# Patient Record
Sex: Male | Born: 1945 | Race: White | Hispanic: No | Marital: Married
Health system: Southern US, Community
[De-identification: ages and names within clinical notes are randomized; demographics above are authoritative.]

## PROBLEM LIST (undated history)

## (undated) DIAGNOSIS — E7800 Pure hypercholesterolemia, unspecified: Secondary | ICD-10-CM

## (undated) DIAGNOSIS — I10 Essential (primary) hypertension: Secondary | ICD-10-CM

## (undated) DIAGNOSIS — I2510 Atherosclerotic heart disease of native coronary artery without angina pectoris: Secondary | ICD-10-CM

## (undated) DIAGNOSIS — K4090 Unilateral inguinal hernia, without obstruction or gangrene, not specified as recurrent: Secondary | ICD-10-CM

## (undated) HISTORY — DX: Atherosclerotic heart disease of native coronary artery without angina pectoris: I25.10

## (undated) HISTORY — DX: Essential (primary) hypertension: I10

## (undated) HISTORY — DX: Unilateral inguinal hernia, without obstruction or gangrene, not specified as recurrent: K40.90

## (undated) HISTORY — DX: Pure hypercholesterolemia, unspecified: E78.00

---

## 2000-06-02 ENCOUNTER — Encounter: Admission: RE | Admit: 2000-06-02 | Discharge: 2000-06-02 | Payer: Self-pay | Admitting: Occupational Medicine

## 2000-06-02 ENCOUNTER — Encounter: Payer: Self-pay | Admitting: Occupational Medicine

## 2000-11-11 ENCOUNTER — Encounter: Payer: Self-pay | Admitting: Occupational Medicine

## 2000-11-11 ENCOUNTER — Encounter: Admission: RE | Admit: 2000-11-11 | Discharge: 2000-11-11 | Payer: Self-pay | Admitting: Occupational Medicine

## 2002-05-03 ENCOUNTER — Encounter: Admission: RE | Admit: 2002-05-03 | Discharge: 2002-05-03 | Payer: Self-pay | Admitting: Internal Medicine

## 2002-05-03 ENCOUNTER — Encounter: Payer: Self-pay | Admitting: Internal Medicine

## 2002-11-10 ENCOUNTER — Encounter: Admission: RE | Admit: 2002-11-10 | Discharge: 2002-11-10 | Payer: Self-pay | Admitting: Family Medicine

## 2002-11-10 ENCOUNTER — Encounter: Payer: Self-pay | Admitting: Family Medicine

## 2004-03-17 ENCOUNTER — Emergency Department (HOSPITAL_COMMUNITY): Admission: EM | Admit: 2004-03-17 | Discharge: 2004-03-17 | Payer: Self-pay | Admitting: Family Medicine

## 2004-11-14 ENCOUNTER — Encounter: Admission: RE | Admit: 2004-11-14 | Discharge: 2004-11-14 | Payer: Self-pay | Admitting: Occupational Medicine

## 2005-01-11 ENCOUNTER — Ambulatory Visit: Payer: Self-pay | Admitting: Family Medicine

## 2005-01-25 ENCOUNTER — Ambulatory Visit: Payer: Self-pay | Admitting: Family Medicine

## 2005-02-22 ENCOUNTER — Ambulatory Visit: Payer: Self-pay | Admitting: Gastroenterology

## 2005-03-08 ENCOUNTER — Ambulatory Visit: Payer: Self-pay | Admitting: Gastroenterology

## 2005-03-08 ENCOUNTER — Encounter (INDEPENDENT_AMBULATORY_CARE_PROVIDER_SITE_OTHER): Payer: Self-pay | Admitting: *Deleted

## 2005-06-11 ENCOUNTER — Ambulatory Visit: Payer: Self-pay | Admitting: Family Medicine

## 2005-11-02 IMAGING — CR DG CHEST 2V
2 series · 2 of 2 positions shown · non-contrast
Comparison: none

CLINICAL DATA: Biannual physical.
 CHEST ? 2 VIEW:
 Heart size is normal.  The mediastinum is unremarkable.  The lungs are clear.  No effusions.  Ordinary degenerative changes affect the spine.

[view not recorded (1 of 2)]
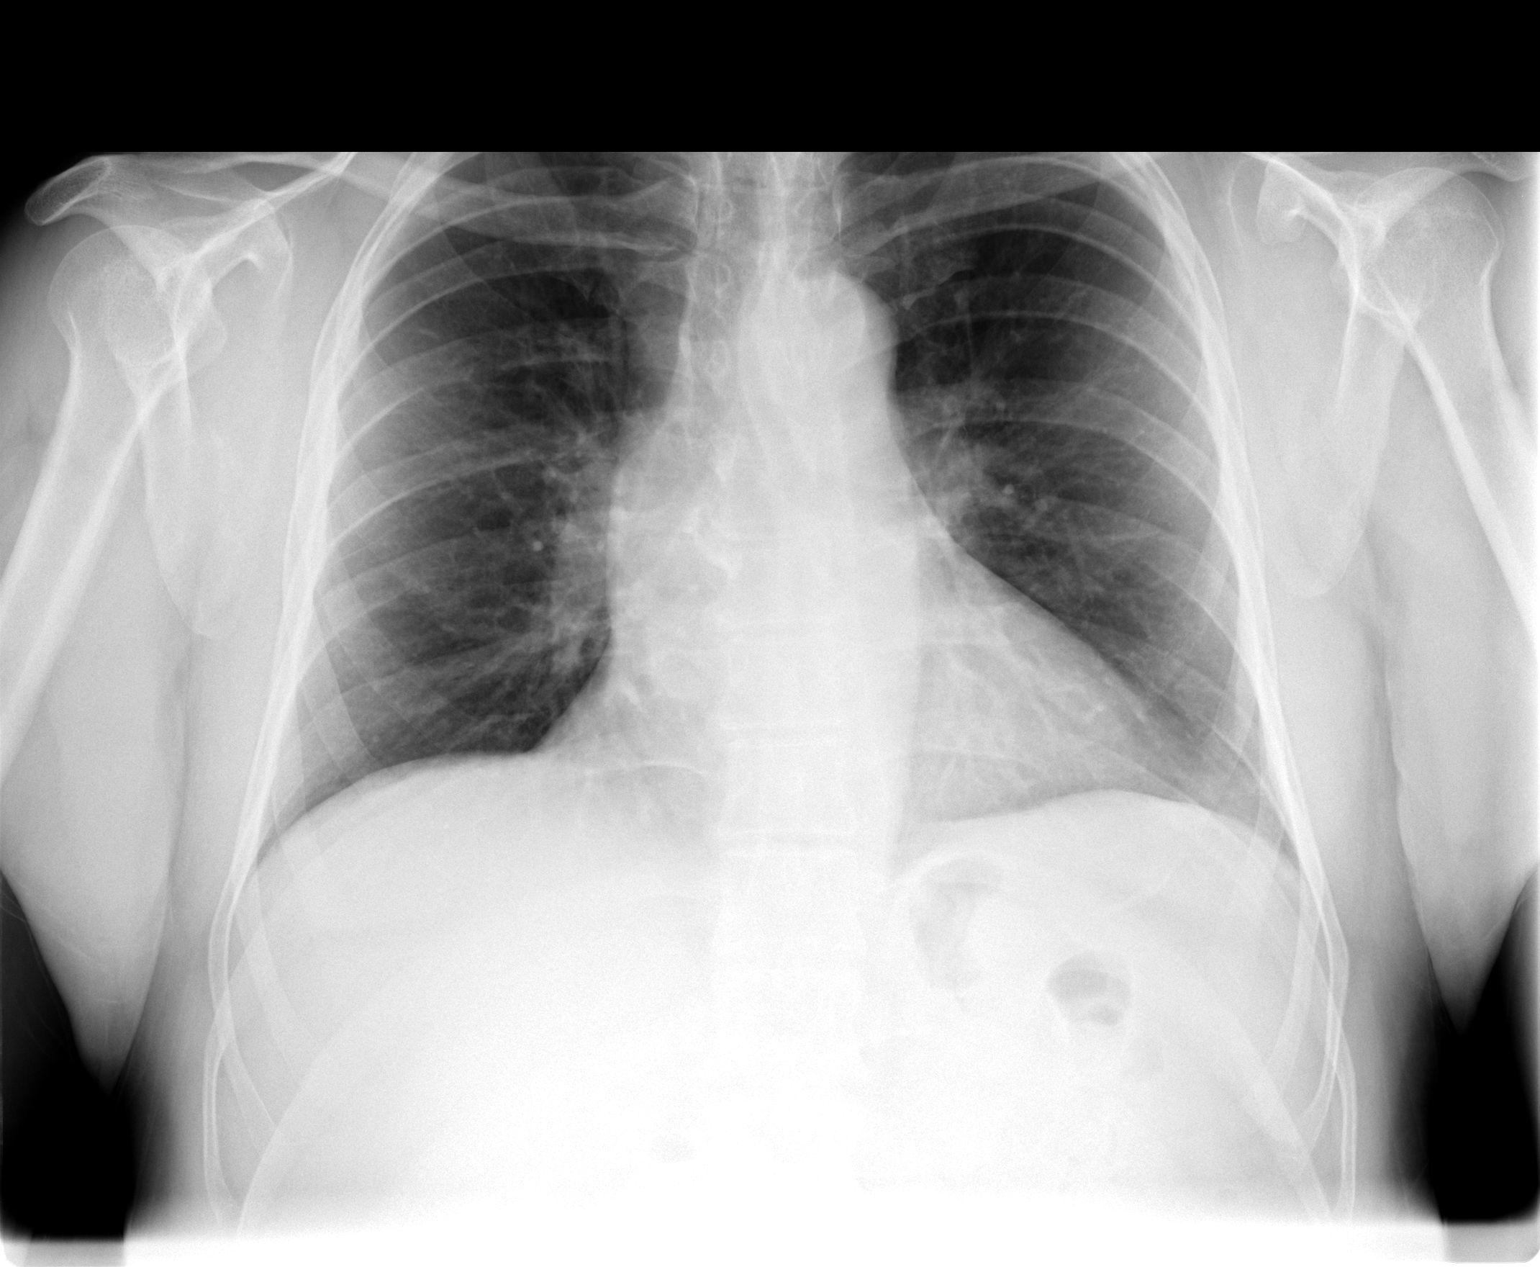

[view not recorded (2 of 2)]
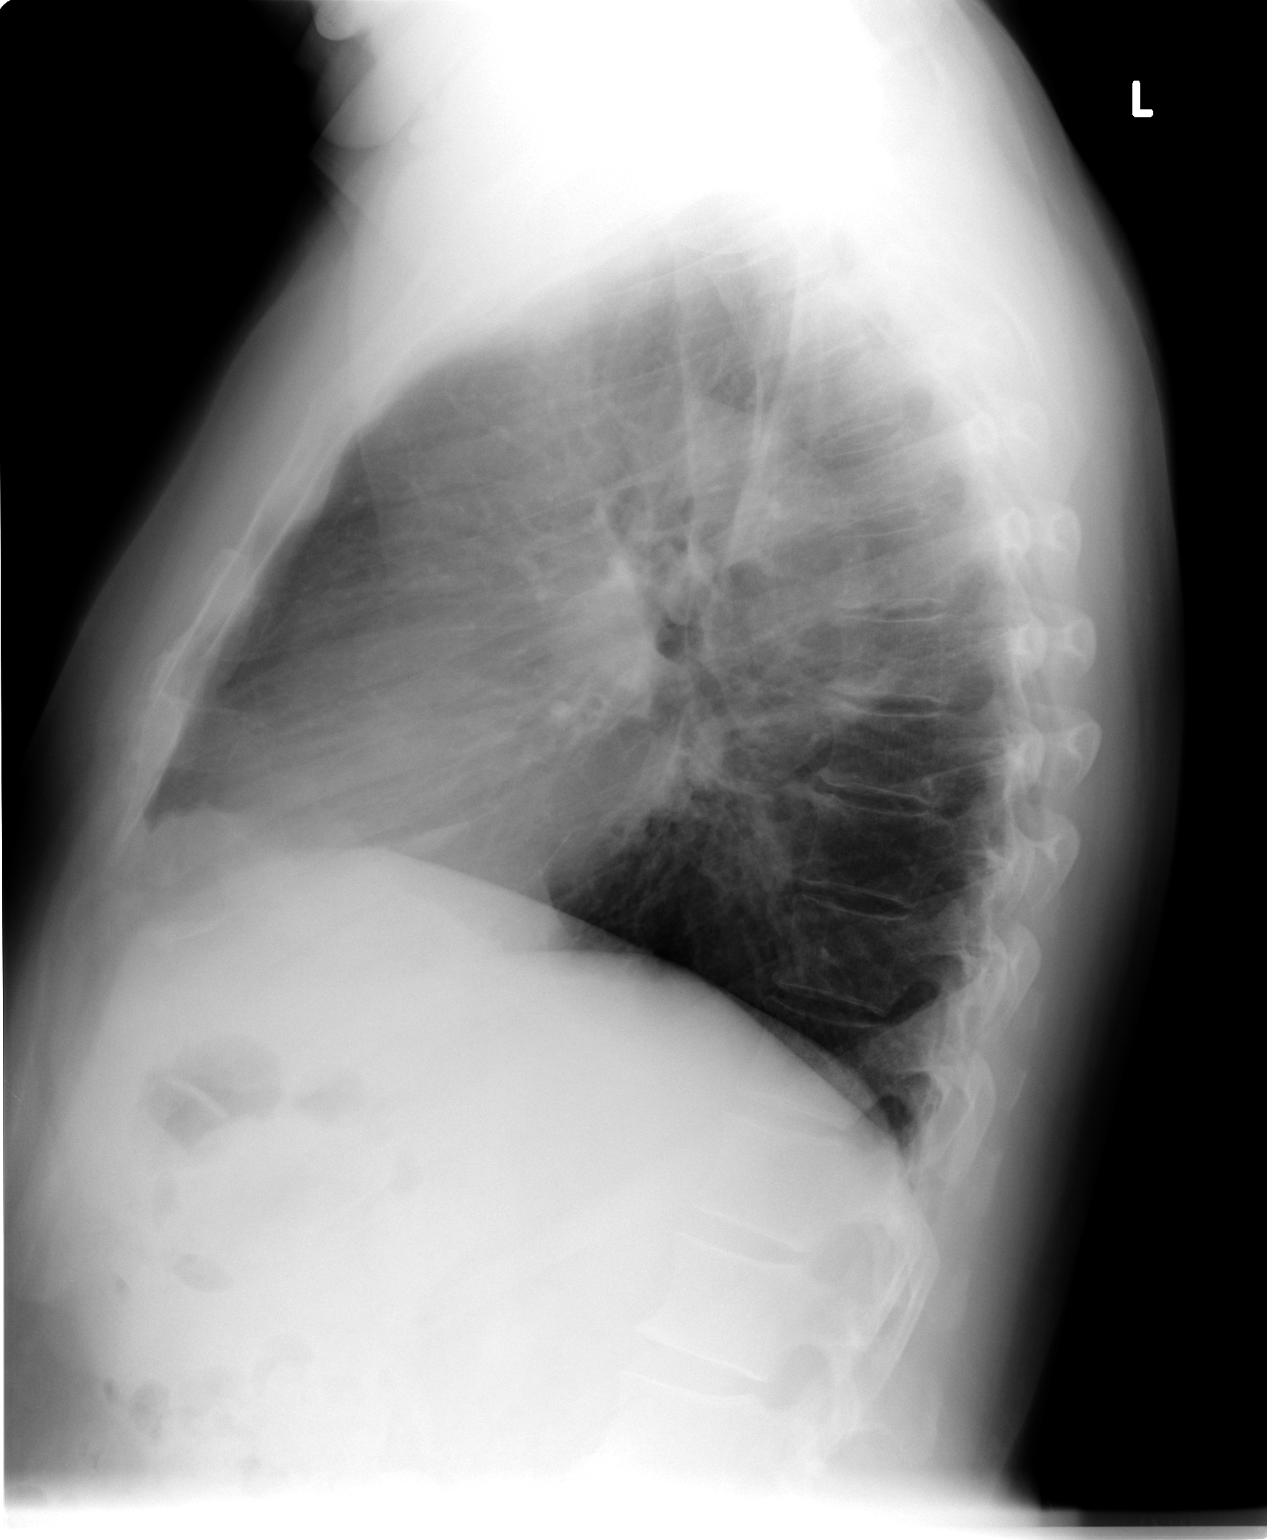

[2 of 2 positions shown; findings below may reference images not displayed]

IMPRESSION: No active disease.

## 2006-07-18 ENCOUNTER — Ambulatory Visit: Payer: Self-pay | Admitting: Family Medicine

## 2006-07-18 LAB — CONVERTED CEMR LAB
Basophils Absolute: 0 10*3/uL (ref 0.0–0.1)
Basophils Relative: 0.1 % (ref 0.0–1.0)
CO2: 25 meq/L (ref 19–32)
Calcium: 9.3 mg/dL (ref 8.4–10.5)
Chloride: 105 meq/L (ref 96–112)
Chol/HDL Ratio, serum: 3.9
Cholesterol: 154 mg/dL (ref 0–200)
Glomerular Filtration Rate, Af Am: 99 mL/min/{1.73_m2}
Glucose, Bld: 99 mg/dL (ref 70–99)
HDL: 39 mg/dL (ref >39.0)
LDL Cholesterol: 103 mg/dL — ABNORMAL HIGH (ref 0–99)
Lymphocytes Relative: 12.8 % (ref 12.0–46.0)
MCHC: 33.6 g/dL (ref 30.0–36.0)
MCV: 91.5 fL (ref 78.0–100.0)
Monocytes Absolute: 0.6 10*3/uL (ref 0.2–0.7)
Monocytes Relative: 7.7 % (ref 3.0–11.0)
Neutro Abs: 6.5 10*3/uL (ref 1.4–7.7)
Platelets: 283 10*3/uL (ref 150–400)
TSH: 2.29 microintl units/mL (ref 0.35–5.50)
VLDL: 12 mg/dL (ref 0–40)

## 2006-11-06 ENCOUNTER — Encounter: Admission: RE | Admit: 2006-11-06 | Discharge: 2006-11-06 | Payer: Self-pay | Admitting: Occupational Medicine

## 2007-03-20 ENCOUNTER — Telehealth: Payer: Self-pay | Admitting: *Deleted

## 2007-07-09 ENCOUNTER — Ambulatory Visit: Payer: Self-pay | Admitting: Family Medicine

## 2007-07-09 DIAGNOSIS — I10 Essential (primary) hypertension: Secondary | ICD-10-CM | POA: Insufficient documentation

## 2007-07-09 DIAGNOSIS — K219 Gastro-esophageal reflux disease without esophagitis: Secondary | ICD-10-CM

## 2007-07-09 DIAGNOSIS — R32 Unspecified urinary incontinence: Secondary | ICD-10-CM

## 2007-07-09 DIAGNOSIS — Z8546 Personal history of malignant neoplasm of prostate: Secondary | ICD-10-CM

## 2007-07-09 DIAGNOSIS — J018 Other acute sinusitis: Secondary | ICD-10-CM

## 2007-07-09 DIAGNOSIS — E785 Hyperlipidemia, unspecified: Secondary | ICD-10-CM

## 2007-07-09 DIAGNOSIS — Z87442 Personal history of urinary calculi: Secondary | ICD-10-CM

## 2007-07-09 DIAGNOSIS — Z9189 Other specified personal risk factors, not elsewhere classified: Secondary | ICD-10-CM | POA: Insufficient documentation

## 2007-07-24 ENCOUNTER — Ambulatory Visit: Payer: Self-pay | Admitting: Family Medicine

## 2007-08-07 ENCOUNTER — Telehealth: Payer: Self-pay | Admitting: Family Medicine

## 2007-10-25 IMAGING — CR DG CHEST 2V
2 series · 2 of 2 positions shown · non-contrast
Comparison: 11/14/04.

CLINICAL DATA: 60-year-old, physical exam.  Occupational health. 
 CHEST - 2 VIEW:

[view not recorded (1 of 2)]
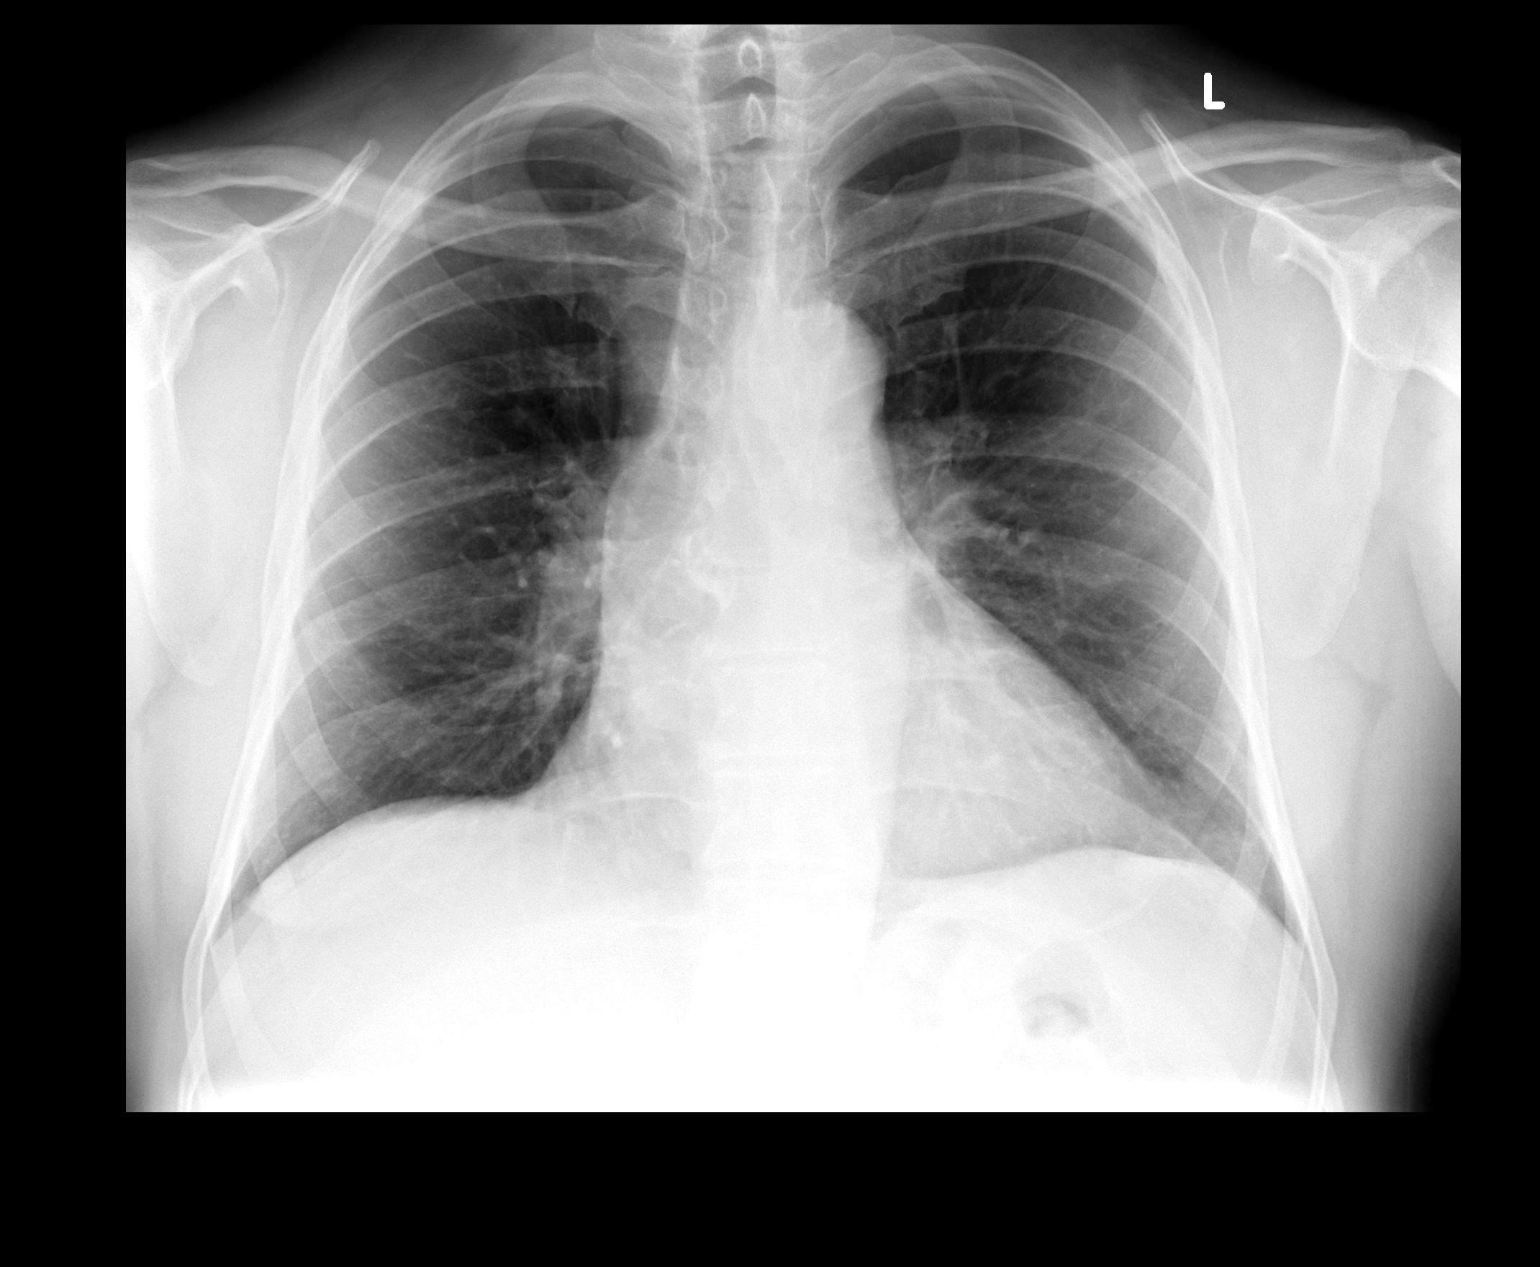

[view not recorded (2 of 2)]
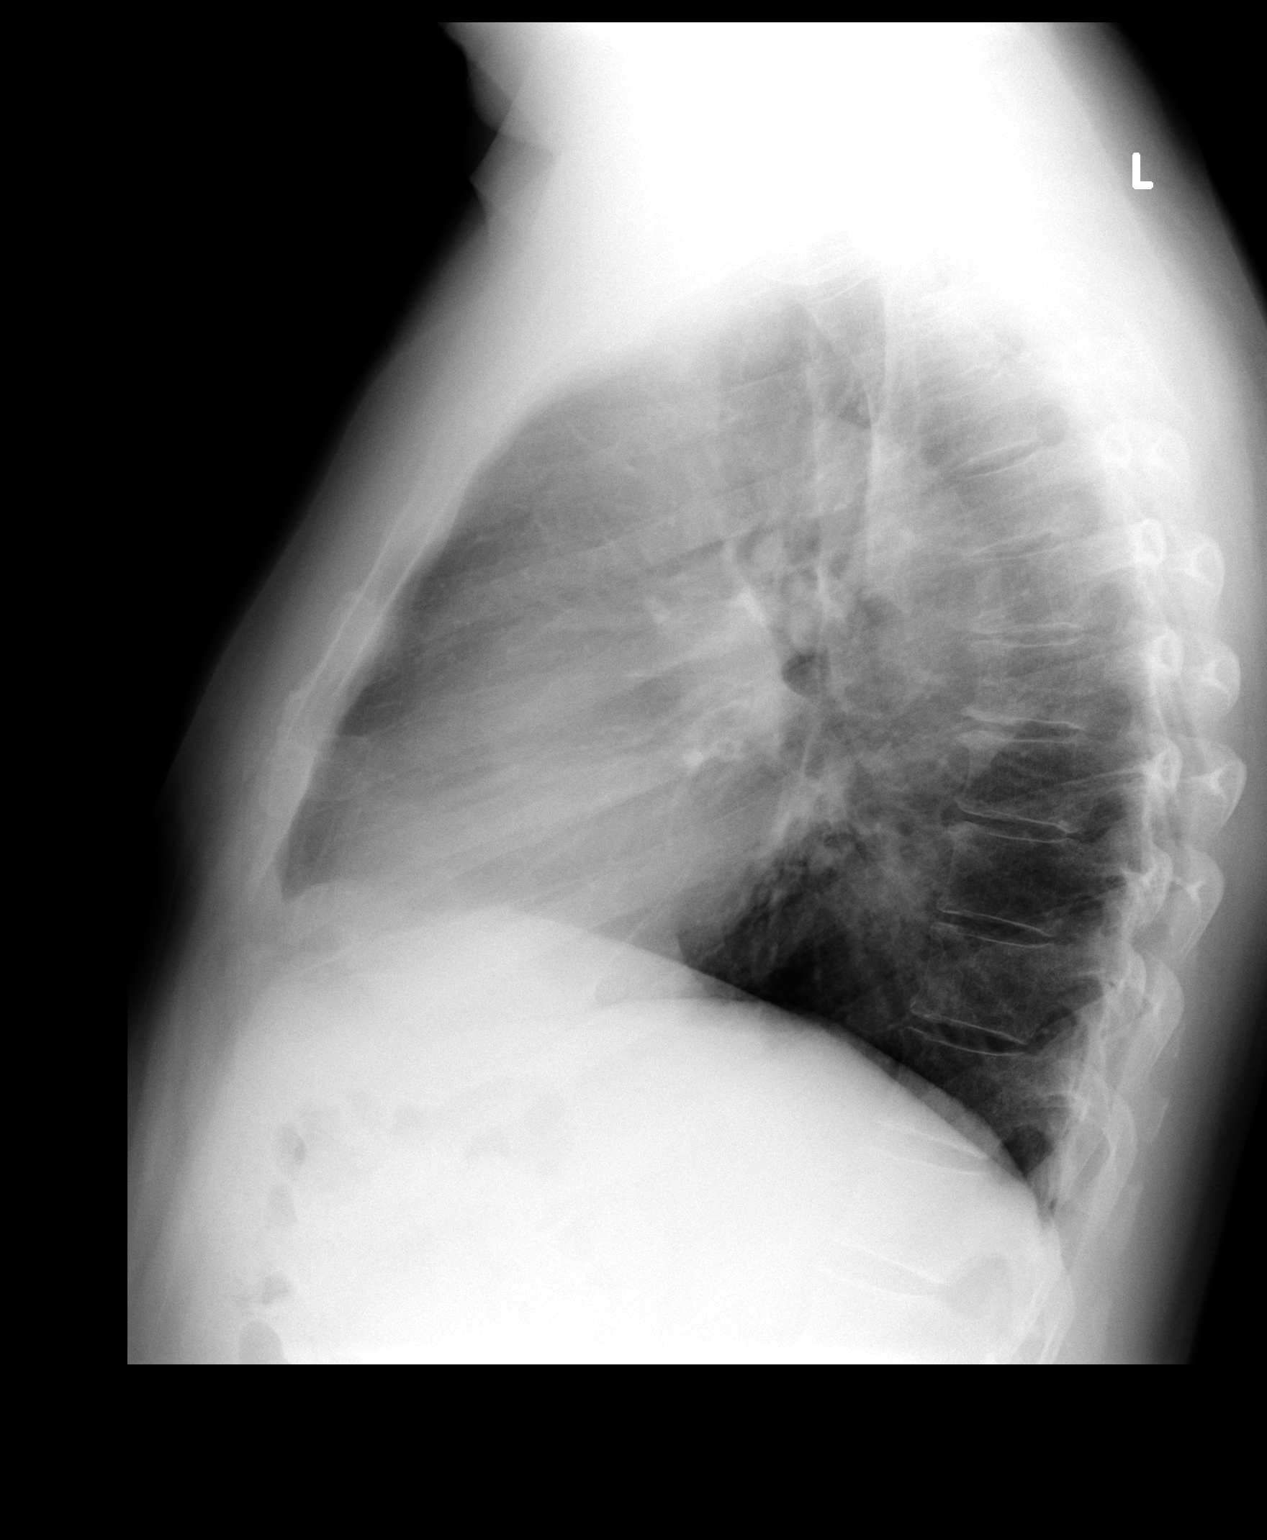

[2 of 2 positions shown; findings below may reference images not displayed]

FINDINGS: Cardiac silhouette is upper limits of normal and stable.  Prominent right hilum appears to be partly ascending aorta and is unchanged.  No acute pulmonary findings.  Bony structures are intact.
IMPRESSION: No acute cardiopulmonary findings.  Stable appearance of the chest since 11/14/04.

## 2008-10-31 ENCOUNTER — Encounter: Admission: RE | Admit: 2008-10-31 | Discharge: 2008-10-31 | Payer: Self-pay | Admitting: Internal Medicine

## 2010-02-02 ENCOUNTER — Encounter (INDEPENDENT_AMBULATORY_CARE_PROVIDER_SITE_OTHER): Payer: Self-pay | Admitting: *Deleted

## 2010-09-25 NOTE — Letter (Signed)
Summary: Colonoscopy Letter  [facility]  [address] [place], [place] [address]   Phone: [phone]  Fax: [phone]      February 02, 2010 MRN: [id number]   Willie Mckee [address] [place], [place]  [address]   Dear [name],   According to your medical record, it is time for you to schedule a Colonoscopy. The American Cancer Society recommends this procedure as a method to detect early colon cancer. Patients with a family history of colon cancer, or a personal history of colon polyps or inflammatory bowel disease are at increased risk.  This letter has beeen generated based on the recommendations made at the time of your procedure. If you feel that in your particular situation this may no longer apply, please contact our office.  Please call our office at [phone] to schedule this appointment or to update your records at your earliest convenience.  Thank you for cooperating with [place] to provide you with the very best care possible.   Sincerely,  Judie Petit T. Russella Dar, M.D.  [facility] [phone]

## 2011-10-03 ENCOUNTER — Telehealth: Payer: Self-pay

## 2011-10-10 NOTE — Telephone Encounter (Signed)
Left another message for patient to return my call. Letter to be mailed for Recall Colonoscopy.

## 2012-03-20 ENCOUNTER — Encounter: Payer: Self-pay | Admitting: Gastroenterology

## 2015-06-19 HISTORY — PX: CORONARY ARTERY BYPASS GRAFT: SHX141

## 2017-05-18 HISTORY — PX: UMBILICAL HERNIA REPAIR: SHX196

## 2017-05-18 HISTORY — PX: CHOLECYSTECTOMY OPEN: SUR202

## 2017-06-16 ENCOUNTER — Telehealth: Payer: Self-pay | Admitting: Cardiology

## 2017-06-16 NOTE — Telephone Encounter (Signed)
Received records from [facility] for appointment on 07/01/17 with Dr SwazilandJordan.  Records put with Dr Elvis CoilJordan's schedule for 07/01/17. lp

## 2017-06-25 ENCOUNTER — Encounter: Payer: Self-pay | Admitting: Cardiology

## 2017-06-25 NOTE — Progress Notes (Deleted)
Cardiology Office Note   Date:  06/25/2017   ID:  Willie DrownClaude L Geffrard, DOB 11/19/1945, MRN [id number]  PCP:  Nelwyn SalisburyFry, Stephen A, MD  Cardiologist:   Peter SwazilandJordan, MD   No chief complaint on file.     History of Present Illness: Willie Mckee is a 71 y.o. male who is seen at the request of Micael HampshireKathy Brown NP for evaluation of lower extremity edema. He has a history of CAD and presented in October 2016 with a NSTEMI. He underwent cardiac cath and was found to have 70% distal left main disease, 90% mid LAD lesion, 80% proximal LCx lesion, 90% posterolateral branch lesion, and dominant RCA with proximal segment plaque of 60% and PDA with 80% lesion. Echocardiogram revealed EF 45% with hypokinesis of anterolateral myocardium consistent with ischemia in distribution of LAD. Grade 1 diastolic dysfunction. No valve disease. He subsequently underwent CABG by Dr. Deberah CastleJohn Alexander with LIMA to LAD, SVG to OM, SVG to diagonal, and SVG to RCA. He has a history of HTN and HLD.     Past Medical History:  Diagnosis Date  . CAD (coronary artery disease)   . HTN (hypertension)   . Hypercholesterolemia   . Right inguinal hernia     Past Surgical History:  Procedure Laterality Date  . CHOLECYSTECTOMY OPEN  05/18/2017  . CORONARY ARTERY BYPASS GRAFT  06/19/2015   LIMA to LAD, SVG to diagonal, SVG to OM, SVG to RCA  . UMBILICAL HERNIA REPAIR  05/18/2017     No current outpatient prescriptions on file.   No current facility-administered medications for this visit.     Allergies:   Patient has no allergy information on record.    Social History:  The patient     Family History:  The patient's ***family history is not on file.    ROS:  Please see the history of present illness.   Otherwise, review of systems are positive for {NONE DEFAULTED:18576::"none"}.   All other systems are reviewed and negative.    PHYSICAL EXAM: VS:  There were no vitals taken for this visit. , BMI There is no height or  weight on file to calculate BMI. GEN: Well nourished, well developed, in no acute distress  HEENT: normal  Neck: no JVD, carotid bruits, or masses Cardiac: ***RRR; no murmurs, rubs, or gallops,no edema  Respiratory:  clear to auscultation bilaterally, normal work of breathing GI: soft, nontender, nondistended, + BS MS: no deformity or atrophy  Skin: warm and dry, no rash Neuro:  Strength and sensation are intact Psych: euthymic mood, full affect   EKG:  EKG {ACTION; IS/IS [id number]}OT:[id number]} ordered today. The ekg ordered today demonstrates ***   Recent Labs: No results found for requested labs within last 8760 hours.    Lipid Panel    Component Value Date/Time   CHOL 154 07/18/2006 1258   TRIG 62 07/18/2006 1258   HDL 39.0 07/18/2006 1258   CHOLHDL 3.9 CALC 07/18/2006 1258   VLDL 12 07/18/2006 1258   LDLCALC 103 (H) 07/18/2006 1258      Wt Readings from Last 3 Encounters:  07/09/07 182 lb (82.6 kg)      Other studies Reviewed: Additional studies/ records that were reviewed today include:  Labs dated 05/16/17: glucose 167, sodium 134, total bili 1.48. Otherwise CMET normal. WBC 17.7. Otherwise CBC normal.   ASSESSMENT AND PLAN:  1.  ***   Current medicines are reviewed at length with the patient today.  The patient {ACTIONS;  HAS/DOES NOT HAVE:19233} concerns regarding medicines.  The following changes have been made:  {PLAN; NO CHANGE:13088:s}  Labs/ tests ordered today include: *** No orders of the defined types were placed in this encounter.    Disposition:   FU with *** in {gen number 1-61:096045} {Days to years:10300}  Signed, Peter Swaziland, MD  06/25/2017 5:15 PM    [facility] [address], [place], [place], [address] Phone [phone], Fax [phone]

## 2017-07-01 ENCOUNTER — Ambulatory Visit: Payer: Self-pay | Admitting: Cardiology

## 2017-09-01 ENCOUNTER — Ambulatory Visit: Payer: Self-pay | Admitting: Cardiology
